# Patient Record
Sex: Female | Born: 1955
Health system: Southern US, Community
[De-identification: ages and names within clinical notes are randomized; demographics above are authoritative.]

## PROBLEM LIST (undated history)

## (undated) ENCOUNTER — Emergency Department (HOSPITAL_BASED_OUTPATIENT_CLINIC_OR_DEPARTMENT_OTHER): Payer: Self-pay | Source: Home / Self Care

## (undated) DIAGNOSIS — K611 Rectal abscess: Secondary | ICD-10-CM

## (undated) DIAGNOSIS — I10 Essential (primary) hypertension: Secondary | ICD-10-CM

## (undated) DIAGNOSIS — K602 Anal fissure, unspecified: Secondary | ICD-10-CM

---

## 1999-10-18 ENCOUNTER — Other Ambulatory Visit: Admission: RE | Admit: 1999-10-18 | Discharge: 1999-10-18 | Payer: Self-pay | Admitting: *Deleted

## 2001-02-25 ENCOUNTER — Other Ambulatory Visit: Admission: RE | Admit: 2001-02-25 | Discharge: 2001-02-25 | Payer: Self-pay | Admitting: Obstetrics and Gynecology

## 2003-03-15 ENCOUNTER — Other Ambulatory Visit: Admission: RE | Admit: 2003-03-15 | Discharge: 2003-03-15 | Payer: Self-pay | Admitting: Obstetrics and Gynecology

## 2005-05-01 ENCOUNTER — Encounter: Admission: RE | Admit: 2005-05-01 | Discharge: 2005-05-01 | Payer: Self-pay | Admitting: Unknown Physician Specialty

## 2016-10-31 DIAGNOSIS — R110 Nausea: Secondary | ICD-10-CM | POA: Diagnosis not present

## 2016-10-31 DIAGNOSIS — R51 Headache: Secondary | ICD-10-CM | POA: Diagnosis not present

## 2016-10-31 DIAGNOSIS — R42 Dizziness and giddiness: Secondary | ICD-10-CM | POA: Diagnosis not present

## 2016-11-01 DIAGNOSIS — R51 Headache: Secondary | ICD-10-CM | POA: Diagnosis not present

## 2016-11-01 DIAGNOSIS — R42 Dizziness and giddiness: Secondary | ICD-10-CM | POA: Diagnosis not present

## 2016-11-01 DIAGNOSIS — Z8489 Family history of other specified conditions: Secondary | ICD-10-CM | POA: Diagnosis not present

## 2016-11-08 DIAGNOSIS — D329 Benign neoplasm of meninges, unspecified: Secondary | ICD-10-CM | POA: Diagnosis not present

## 2016-11-15 DIAGNOSIS — D329 Benign neoplasm of meninges, unspecified: Secondary | ICD-10-CM | POA: Diagnosis not present

## 2017-01-13 DIAGNOSIS — I10 Essential (primary) hypertension: Secondary | ICD-10-CM | POA: Diagnosis not present

## 2017-01-13 DIAGNOSIS — E785 Hyperlipidemia, unspecified: Secondary | ICD-10-CM | POA: Diagnosis not present

## 2017-02-05 DIAGNOSIS — R922 Inconclusive mammogram: Secondary | ICD-10-CM | POA: Diagnosis not present

## 2017-02-05 DIAGNOSIS — N6311 Unspecified lump in the right breast, upper outer quadrant: Secondary | ICD-10-CM | POA: Diagnosis not present

## 2017-02-28 DIAGNOSIS — D329 Benign neoplasm of meninges, unspecified: Secondary | ICD-10-CM | POA: Diagnosis not present

## 2017-03-26 DIAGNOSIS — J209 Acute bronchitis, unspecified: Secondary | ICD-10-CM | POA: Diagnosis not present

## 2017-08-19 DIAGNOSIS — D329 Benign neoplasm of meninges, unspecified: Secondary | ICD-10-CM | POA: Diagnosis not present

## 2017-12-05 DIAGNOSIS — H04123 Dry eye syndrome of bilateral lacrimal glands: Secondary | ICD-10-CM | POA: Diagnosis not present

## 2017-12-05 DIAGNOSIS — H2513 Age-related nuclear cataract, bilateral: Secondary | ICD-10-CM | POA: Diagnosis not present

## 2017-12-05 DIAGNOSIS — H1852 Epithelial (juvenile) corneal dystrophy: Secondary | ICD-10-CM | POA: Diagnosis not present

## 2018-01-12 DIAGNOSIS — Z6832 Body mass index (BMI) 32.0-32.9, adult: Secondary | ICD-10-CM | POA: Diagnosis not present

## 2018-01-12 DIAGNOSIS — Z0000 Encounter for general adult medical examination without abnormal findings: Secondary | ICD-10-CM | POA: Diagnosis not present

## 2018-01-12 DIAGNOSIS — Z23 Encounter for immunization: Secondary | ICD-10-CM | POA: Diagnosis not present

## 2020-09-04 DIAGNOSIS — R197 Diarrhea, unspecified: Secondary | ICD-10-CM | POA: Diagnosis not present

## 2020-09-05 DIAGNOSIS — R197 Diarrhea, unspecified: Secondary | ICD-10-CM | POA: Diagnosis not present

## 2020-11-03 DIAGNOSIS — R194 Change in bowel habit: Secondary | ICD-10-CM | POA: Diagnosis not present

## 2020-11-03 DIAGNOSIS — K602 Anal fissure, unspecified: Secondary | ICD-10-CM | POA: Diagnosis not present

## 2020-12-20 DIAGNOSIS — Z6830 Body mass index (BMI) 30.0-30.9, adult: Secondary | ICD-10-CM | POA: Diagnosis not present

## 2020-12-20 DIAGNOSIS — L0231 Cutaneous abscess of buttock: Secondary | ICD-10-CM | POA: Diagnosis not present

## 2020-12-22 DIAGNOSIS — Z23 Encounter for immunization: Secondary | ICD-10-CM | POA: Diagnosis not present

## 2020-12-25 DIAGNOSIS — Z6830 Body mass index (BMI) 30.0-30.9, adult: Secondary | ICD-10-CM | POA: Diagnosis not present

## 2020-12-25 DIAGNOSIS — L729 Follicular cyst of the skin and subcutaneous tissue, unspecified: Secondary | ICD-10-CM | POA: Diagnosis not present

## 2020-12-26 DIAGNOSIS — L089 Local infection of the skin and subcutaneous tissue, unspecified: Secondary | ICD-10-CM | POA: Diagnosis not present

## 2020-12-26 DIAGNOSIS — L723 Sebaceous cyst: Secondary | ICD-10-CM | POA: Diagnosis not present

## 2021-01-05 DIAGNOSIS — Z23 Encounter for immunization: Secondary | ICD-10-CM | POA: Diagnosis not present

## 2021-01-15 DIAGNOSIS — R194 Change in bowel habit: Secondary | ICD-10-CM | POA: Diagnosis not present

## 2021-01-15 DIAGNOSIS — K602 Anal fissure, unspecified: Secondary | ICD-10-CM | POA: Diagnosis not present

## 2021-01-15 DIAGNOSIS — K6289 Other specified diseases of anus and rectum: Secondary | ICD-10-CM | POA: Diagnosis not present

## 2021-02-11 ENCOUNTER — Encounter (HOSPITAL_COMMUNITY): Payer: Self-pay | Admitting: Emergency Medicine

## 2021-02-11 ENCOUNTER — Emergency Department (HOSPITAL_COMMUNITY): Payer: BC Managed Care – PPO

## 2021-02-11 ENCOUNTER — Other Ambulatory Visit: Payer: Self-pay

## 2021-02-11 ENCOUNTER — Emergency Department (HOSPITAL_COMMUNITY)
Admission: EM | Admit: 2021-02-11 | Discharge: 2021-02-11 | Disposition: A | Discharge status: Home / Self Care | Payer: BC Managed Care – PPO | Attending: Emergency Medicine | Admitting: Emergency Medicine

## 2021-02-11 DIAGNOSIS — R197 Diarrhea, unspecified: Secondary | ICD-10-CM | POA: Diagnosis not present

## 2021-02-11 DIAGNOSIS — R109 Unspecified abdominal pain: Secondary | ICD-10-CM | POA: Diagnosis not present

## 2021-02-11 HISTORY — DX: Rectal abscess: K61.1

## 2021-02-11 HISTORY — DX: Anal fissure, unspecified: K60.2

## 2021-02-11 HISTORY — DX: Essential (primary) hypertension: I10

## 2021-02-11 LAB — COMPREHENSIVE METABOLIC PANEL
ALT: 26 U/L (ref 0–44)
AST: 38 U/L (ref 15–41)
Albumin: 5.3 g/dL — ABNORMAL HIGH (ref 3.5–5.0)
Alkaline Phosphatase: 49 U/L (ref 38–126)
Anion gap: 11 (ref 5–15)
BUN: 9 mg/dL (ref 8–23)
CO2: 25 mmol/L (ref 22–32)
Calcium: 10.1 mg/dL (ref 8.9–10.3)
Chloride: 101 mmol/L (ref 98–111)
Creatinine, Ser: 0.97 mg/dL (ref 0.44–1.00)
GFR, Estimated: >60 mL/min (ref >60)
Glucose, Bld: 121 mg/dL — ABNORMAL HIGH (ref 70–99)
Potassium: 4.2 mmol/L (ref 3.5–5.1)
Sodium: 137 mmol/L (ref 135–145)
Total Bilirubin: 1.6 mg/dL — ABNORMAL HIGH (ref 0.3–1.2)
Total Protein: 7.9 g/dL (ref 6.5–8.1)

## 2021-02-11 LAB — URINALYSIS, ROUTINE W REFLEX MICROSCOPIC
Bacteria, UA: NONE SEEN
Bilirubin Urine: NEGATIVE
Glucose, UA: NEGATIVE mg/dL
Ketones, ur: NEGATIVE mg/dL
Nitrite: NEGATIVE
Protein, ur: NEGATIVE mg/dL
Specific Gravity, Urine: 1.02 (ref 1.005–1.030)
pH: 8 (ref 5.0–8.0)

## 2021-02-11 LAB — CBC WITH DIFFERENTIAL/PLATELET
Abs Immature Granulocytes: 0.05 10*3/uL (ref 0.00–0.07)
Basophils Absolute: 0.1 10*3/uL (ref 0.0–0.1)
Basophils Relative: 0 %
Eosinophils Absolute: 0.1 10*3/uL (ref 0.0–0.5)
Eosinophils Relative: 1 %
HCT: 43.1 % (ref 36.0–46.0)
Hemoglobin: 14.9 g/dL (ref 12.0–15.0)
Immature Granulocytes: 0 %
Lymphocytes Relative: 7 %
Lymphs Abs: 1.1 10*3/uL (ref 0.7–4.0)
MCH: 31.6 pg (ref 26.0–34.0)
MCHC: 34.6 g/dL (ref 30.0–36.0)
MCV: 91.5 fL (ref 80.0–100.0)
Monocytes Absolute: 1.3 10*3/uL — ABNORMAL HIGH (ref 0.1–1.0)
Monocytes Relative: 8 %
Neutro Abs: 12.4 10*3/uL — ABNORMAL HIGH (ref 1.7–7.7)
Neutrophils Relative %: 84 %
Platelets: 323 10*3/uL (ref 150–400)
RBC: 4.71 MIL/uL (ref 3.87–5.11)
RDW: 11.7 % (ref 11.5–15.5)
WBC: 15 10*3/uL — ABNORMAL HIGH (ref 4.0–10.5)
nRBC: 0 % (ref 0.0–0.2)

## 2021-02-11 LAB — LIPASE, BLOOD: Lipase: 26 U/L (ref 11–51)

## 2021-02-11 MED ORDER — ONDANSETRON HCL 4 MG/2ML IJ SOLN
4.0000 mg | Freq: Once | INTRAMUSCULAR | Status: AC
  Administered 2021-02-11: 16:00:00 4 mg via INTRAVENOUS
  Filled 2021-02-11: qty 2

## 2021-02-11 MED ORDER — MORPHINE SULFATE (PF) 2 MG/ML IV SOLN
2.0000 mg | Freq: Once | INTRAVENOUS | Status: AC
  Administered 2021-02-11: 16:00:00 2 mg via INTRAVENOUS
  Filled 2021-02-11: qty 1

## 2021-02-11 MED ORDER — IOHEXOL 350 MG/ML SOLN
80.0000 mL | Freq: Once | INTRAVENOUS | Status: AC | PRN
  Administered 2021-02-11: 16:00:00 80 mL via INTRAVENOUS

## 2021-02-11 MED ORDER — SODIUM CHLORIDE 0.9 % IV BOLUS
1000.0000 mL | Freq: Once | INTRAVENOUS | Status: AC
  Administered 2021-02-11: 16:00:00 1000 mL via INTRAVENOUS

## 2021-02-11 NOTE — ED Provider Notes (Signed)
Emergency Medicine Provider Triage Evaluation Note  Rhonda Dickerson , a 65 y.o. female  was evaluated in triage.  Pt complains of abdominal pain and diarrhea.  10-12 episodes of loose stool since this morning.  Last episode had mucousy blood.  Had surgery for perirectal abscess aprox 1 month ago >>Dr. Marcello Moores.  States she has been having some pain since.  She is followed by Dr. Daisey Must with GI who thought maybe she has a fissure.  No fever, emesis  Review of Systems  Positive: Abdominal pain, bleeding Negative: Fever, emesis  Physical Exam  BP (!) 160/103 (BP Location: Left Arm)   Pulse 85   Temp 97.9 F (36.6 C) (Oral)   Resp 18   SpO2 95%  Gen:   Awake, no distress   Resp:  Normal effort  MSK:   Moves extremities without difficulty  Other:    Medical Decision Making  Medically screening exam initiated at 2:45 PM.  Appropriate orders placed.  Radha Coggins was informed that the remainder of the evaluation will be completed by another provider, this initial triage assessment does not replace that evaluation, and the importance of remaining in the ED until their evaluation is complete.  Abd pain, diarrhea, bloody from rectum   Sakib Noguez A, PA-C 02/11/21 1447    Varney Biles, MD 02/11/21 1540

## 2021-02-11 NOTE — ED Provider Notes (Signed)
[facility] DEPT Provider Note   CSN: [id number] Arrival date & time: 02/11/21  1422     History Chief Complaint  Patient presents with   Diarrhea   Rectal Bleeding    Rhonda Dickerson is a 65 y.o. female.  65 year old female with prior medical history as detailed below presents for evaluation.  Patient complains of abdominal cramping and profuse loose watery diarrhea.  Symptoms started this morning.  She reports about 12 episodes of loose stool since this morning.  Last several episodes reportedly had blood present in the stool.  She denies associated fever.  She reports associated nausea.  She denies vomiting.  She does report prior recent perirectal abscess drain is approximately 1 month ago with Dr. Marcello Moores.  She reports that she has not been on antibiotics since that procedure.  She is also known to Dr. Jason Coop with GI.  Per the patient, GI thinks that she may have a very small fissure.  The history is provided by the patient.  Diarrhea Quality:  Bloody, mucous and watery Severity:  Moderate Onset quality:  Sudden Number of episodes:  12 Duration:  1 day Timing:  Rare Progression:  Unchanged Relieved by:  Nothing Worsened by:  Nothing Rectal Bleeding     Past Medical History:  Diagnosis Date   Anal fissure    Hypertension    Perirectal abscess     There are no problems to display for this patient.   History reviewed. No pertinent surgical history.   OB History   No obstetric history on file.     History reviewed. No pertinent family history.  Social History   Tobacco Use   Smoking status: Never   Smokeless tobacco: Never    Home Medications Prior to Admission medications   Not on File    Allergies    Patient has no known allergies.  Review of Systems   Review of Systems  Gastrointestinal:  Positive for diarrhea and hematochezia.  All other systems reviewed and are negative.  Physical Exam Updated Vital Signs BP  (!) 160/103 (BP Location: Left Arm)   Pulse 85   Temp 97.9 F (36.6 C) (Oral)   Resp 18   Ht 5\' 2"  (1.575 m)   Wt 70.3 kg   SpO2 95%   BMI 28.35 kg/m   Physical Exam Vitals and nursing note reviewed.  Constitutional:      General: She is not in acute distress.    Appearance: Normal appearance. She is well-developed.  HENT:     Head: Normocephalic and atraumatic.  Eyes:     Conjunctiva/sclera: Conjunctivae normal.     Pupils: Pupils are equal, round, and reactive to light.  Cardiovascular:     Rate and Rhythm: Normal rate and regular rhythm.     Heart sounds: Normal heart sounds.  Pulmonary:     Effort: Pulmonary effort is normal. No respiratory distress.     Breath sounds: Normal breath sounds.  Abdominal:     General: There is no distension.     Palpations: Abdomen is soft.     Tenderness: There is no abdominal tenderness.  Genitourinary:    Comments: Visual rectal exam performed with RN chaperone present in room  Patient with well-healed incision site at prior I and D.   No perirectal erythema or inflammation noted.  No fluctuance noted.  Small number of hemorrhoidal tags present on the rectum itself.  No sign of recent bleeding or hemorrhoidal inflammation.  Patient declines DRE  secondary to rectal pain. Musculoskeletal:        General: No deformity. Normal range of motion.     Cervical back: Normal range of motion and neck supple.  Skin:    General: Skin is warm and dry.  Neurological:     General: No focal deficit present.     Mental Status: She is alert and oriented to person, place, and time.    ED Results / Procedures / Treatments   Labs (all labs ordered are listed, but only abnormal results are displayed) Labs Reviewed  CBC WITH DIFFERENTIAL/PLATELET - Abnormal; Notable for the following components:      Result Value   WBC 15.0 (*)    Neutro Abs 12.4 (*)    Monocytes Absolute 1.3 (*)    All other components within normal limits  COMPREHENSIVE  METABOLIC PANEL - Abnormal; Notable for the following components:   Glucose, Bld 121 (*)    Albumin 5.3 (*)    Total Bilirubin 1.6 (*)    All other components within normal limits  URINALYSIS, ROUTINE W REFLEX MICROSCOPIC - Abnormal; Notable for the following components:   Color, Urine COLORLESS (*)    Hgb urine dipstick SMALL (*)    Leukocytes,Ua SMALL (*)    All other components within normal limits  LIPASE, BLOOD  POC OCCULT BLOOD, ED  TYPE AND SCREEN    EKG None  Radiology CT Abdomen Pelvis W Contrast  Result Date: 02/11/2021 CLINICAL DATA:  Left lower quadrant abdominal pain. EXAM: CT ABDOMEN AND PELVIS WITH CONTRAST TECHNIQUE: Multidetector CT imaging of the abdomen and pelvis was performed using the standard protocol following bolus administration of intravenous contrast. CONTRAST:  71mL OMNIPAQUE IOHEXOL 350 MG/ML SOLN COMPARISON:  None. FINDINGS: Lower chest: No acute abnormality. Hepatobiliary: No focal liver abnormality is seen. No gallstones, gallbladder wall thickening, or biliary dilatation. Pancreas: Unremarkable. No pancreatic ductal dilatation or surrounding inflammatory changes. Spleen: Normal in size without focal abnormality. Adrenals/Urinary Tract: Adrenal glands are unremarkable. Kidneys are normal, without renal calculi, focal lesion, or hydronephrosis. Bladder is unremarkable. Stomach/Bowel: Stomach is within normal limits. Appendix appears normal. No evidence of bowel wall thickening, distention, or inflammatory changes. Occasional sigmoid diverticula without evidence of acute diverticulitis. Vascular/Lymphatic: No significant vascular findings are present. No enlarged abdominal or pelvic lymph nodes. Reproductive: Status post hysterectomy. No adnexal masses. Other: No abdominal wall hernia or abnormality. No abdominopelvic ascites. Musculoskeletal: No acute or significant osseous findings. IMPRESSION: 1. No evidence of acute abnormalities within the abdomen or pelvis.  2. Occasional sigmoid diverticulosis without evidence of acute diverticulitis. Electronically Signed   By: Fidela Salisbury M.D.   On: 02/11/2021 17:22    Procedures Procedures   Medications Ordered in ED Medications  sodium chloride 0.9 % bolus 1,000 mL (1,000 mLs Intravenous New Bag/Given 02/11/21 1628)  ondansetron (ZOFRAN) injection 4 mg (4 mg Intravenous Given 02/11/21 1610)  morphine 2 MG/ML injection 2 mg (2 mg Intravenous Given 02/11/21 1610)  iohexol (OMNIPAQUE) 350 MG/ML injection 80 mL (80 mLs Intravenous Contrast Given 02/11/21 1613)    ED Course  I have reviewed the triage vital signs and the nursing notes.  Pertinent labs & imaging results that were available during my care of the patient were reviewed by me and considered in my medical decision making (see chart for details).    MDM Rules/Calculators/A&P  MDM  MSE complete  Cortney Beissel was evaluated in Emergency Department on 02/11/2021 for the symptoms described in the history of present illness. She was evaluated in the context of the global COVID-19 pandemic, which necessitated consideration that the patient might be at risk for infection with the SARS-CoV-2 virus that causes COVID-19. Institutional protocols and algorithms that pertain to the evaluation of patients at risk for COVID-19 are in a state of rapid change based on information released by regulatory bodies including the CDC and federal and state organizations. These policies and algorithms were followed during the patient's care in the ED.  Patient presented with complaint of multiple episodes of loose watery stools through the course of today.  This was associated with some reported "stringy, clotted blood" passing.  Work-up in the ED is on the whole without significant abnormality.  Screening labs obtained are without significant abnormality.  Patient with hemoglobin of 14.9.  Declined DRE.  She preferred to provide a stool  sample with a BM.  External rectal exam did not demonstrate active bleeding or thrombosed hemorrhoid.  CT imaging was without significant abnormality.  Patient does have history of diverticulosis.  Patient feels significantly improved after ED evaluation.  Of note, she had no BM or rectal bleeding during the 5 hours of her ED stay.  Patient is reassured.  She does have established GI care.  She understands need for close outpatient follow-up with same.  Strict return precautions given and understood.   Final Clinical Impression(s) / ED Diagnoses Final diagnoses:  Diarrhea, unspecified type    Rx / DC Orders ED Discharge Orders     None        Valarie Merino, MD 02/11/21 1925

## 2021-02-11 NOTE — ED Triage Notes (Signed)
PT c/o diarrhea 10-12x since this morning with blood in last stool. Hx surgery for perirectal abscess x1 month ago.

## 2021-02-11 NOTE — Discharge Instructions (Signed)
Return for any problem.   Followup with Dr. Watt Climes as instructed.

## 2021-02-14 DIAGNOSIS — K625 Hemorrhage of anus and rectum: Secondary | ICD-10-CM | POA: Diagnosis not present

## 2021-02-14 DIAGNOSIS — R194 Change in bowel habit: Secondary | ICD-10-CM | POA: Diagnosis not present

## 2021-02-14 DIAGNOSIS — K602 Anal fissure, unspecified: Secondary | ICD-10-CM | POA: Diagnosis not present

## 2021-02-16 DIAGNOSIS — K5289 Other specified noninfective gastroenteritis and colitis: Secondary | ICD-10-CM | POA: Diagnosis not present

## 2021-02-16 DIAGNOSIS — K5730 Diverticulosis of large intestine without perforation or abscess without bleeding: Secondary | ICD-10-CM | POA: Diagnosis not present

## 2021-02-16 DIAGNOSIS — K921 Melena: Secondary | ICD-10-CM | POA: Diagnosis not present

## 2021-02-16 DIAGNOSIS — K649 Unspecified hemorrhoids: Secondary | ICD-10-CM | POA: Diagnosis not present

## 2021-02-20 DIAGNOSIS — L723 Sebaceous cyst: Secondary | ICD-10-CM | POA: Diagnosis not present

## 2021-02-26 DIAGNOSIS — K649 Unspecified hemorrhoids: Secondary | ICD-10-CM | POA: Diagnosis not present

## 2021-02-26 DIAGNOSIS — Z6827 Body mass index (BMI) 27.0-27.9, adult: Secondary | ICD-10-CM | POA: Diagnosis not present

## 2021-03-01 DIAGNOSIS — K602 Anal fissure, unspecified: Secondary | ICD-10-CM | POA: Diagnosis not present

## 2021-03-19 DIAGNOSIS — Z1331 Encounter for screening for depression: Secondary | ICD-10-CM | POA: Diagnosis not present

## 2021-03-19 DIAGNOSIS — I10 Essential (primary) hypertension: Secondary | ICD-10-CM | POA: Diagnosis not present

## 2021-03-19 DIAGNOSIS — Z6825 Body mass index (BMI) 25.0-25.9, adult: Secondary | ICD-10-CM | POA: Diagnosis not present

## 2021-03-19 DIAGNOSIS — E7800 Pure hypercholesterolemia, unspecified: Secondary | ICD-10-CM | POA: Diagnosis not present

## 2021-04-19 DIAGNOSIS — K602 Anal fissure, unspecified: Secondary | ICD-10-CM | POA: Diagnosis not present

## 2021-04-27 DIAGNOSIS — R509 Fever, unspecified: Secondary | ICD-10-CM | POA: Diagnosis not present

## 2021-04-27 DIAGNOSIS — Z6824 Body mass index (BMI) 24.0-24.9, adult: Secondary | ICD-10-CM | POA: Diagnosis not present

## 2021-04-27 DIAGNOSIS — M5432 Sciatica, left side: Secondary | ICD-10-CM | POA: Diagnosis not present

## 2021-04-27 DIAGNOSIS — M25552 Pain in left hip: Secondary | ICD-10-CM | POA: Diagnosis not present

## 2021-05-08 DIAGNOSIS — M62552 Muscle wasting and atrophy, not elsewhere classified, left thigh: Secondary | ICD-10-CM | POA: Diagnosis not present

## 2021-05-08 DIAGNOSIS — M5432 Sciatica, left side: Secondary | ICD-10-CM | POA: Diagnosis not present

## 2021-05-08 DIAGNOSIS — R2689 Other abnormalities of gait and mobility: Secondary | ICD-10-CM | POA: Diagnosis not present

## 2021-05-11 DIAGNOSIS — M5432 Sciatica, left side: Secondary | ICD-10-CM | POA: Diagnosis not present

## 2021-05-11 DIAGNOSIS — R2689 Other abnormalities of gait and mobility: Secondary | ICD-10-CM | POA: Diagnosis not present

## 2021-05-11 DIAGNOSIS — M62552 Muscle wasting and atrophy, not elsewhere classified, left thigh: Secondary | ICD-10-CM | POA: Diagnosis not present

## 2021-05-17 DIAGNOSIS — M62552 Muscle wasting and atrophy, not elsewhere classified, left thigh: Secondary | ICD-10-CM | POA: Diagnosis not present

## 2021-05-17 DIAGNOSIS — R2689 Other abnormalities of gait and mobility: Secondary | ICD-10-CM | POA: Diagnosis not present

## 2021-05-17 DIAGNOSIS — M5432 Sciatica, left side: Secondary | ICD-10-CM | POA: Diagnosis not present

## 2021-05-24 DIAGNOSIS — R2689 Other abnormalities of gait and mobility: Secondary | ICD-10-CM | POA: Diagnosis not present

## 2021-05-24 DIAGNOSIS — M5432 Sciatica, left side: Secondary | ICD-10-CM | POA: Diagnosis not present

## 2021-05-24 DIAGNOSIS — M62552 Muscle wasting and atrophy, not elsewhere classified, left thigh: Secondary | ICD-10-CM | POA: Diagnosis not present

## 2021-05-31 DIAGNOSIS — M5432 Sciatica, left side: Secondary | ICD-10-CM | POA: Diagnosis not present

## 2021-05-31 DIAGNOSIS — M62552 Muscle wasting and atrophy, not elsewhere classified, left thigh: Secondary | ICD-10-CM | POA: Diagnosis not present

## 2021-05-31 DIAGNOSIS — R2689 Other abnormalities of gait and mobility: Secondary | ICD-10-CM | POA: Diagnosis not present

## 2021-06-07 DIAGNOSIS — M62552 Muscle wasting and atrophy, not elsewhere classified, left thigh: Secondary | ICD-10-CM | POA: Diagnosis not present

## 2021-06-07 DIAGNOSIS — R2689 Other abnormalities of gait and mobility: Secondary | ICD-10-CM | POA: Diagnosis not present

## 2021-06-07 DIAGNOSIS — M5432 Sciatica, left side: Secondary | ICD-10-CM | POA: Diagnosis not present

## 2022-10-07 IMAGING — CT CT ABD-PELV W/ CM
2 of 5 series · 16 of 46 positions shown, 18 images · IV contrast (omnipaque)
Comparison: None.

CLINICAL DATA: Left lower quadrant abdominal pain.

EXAM:
CT ABDOMEN AND PELVIS WITH CONTRAST
TECHNIQUE: Multidetector CT imaging of the abdomen and pelvis was performed
using the standard protocol following bolus administration of
intravenous contrast.
CONTRAST:  80mL OMNIPAQUE IOHEXOL 350 MG/ML SOLN

[Series 2: axial st · axial · 0.68mm/px · z∈[+920,+1290]mm · 13 of 88 slices shown, 15 images]
[im 7/88  soft-tissue]
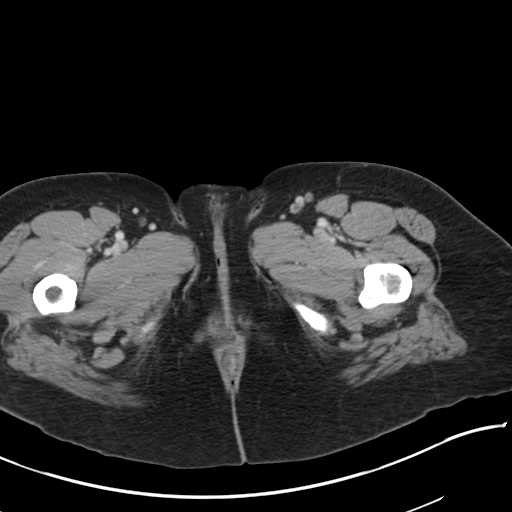
[im 7/88  bone]
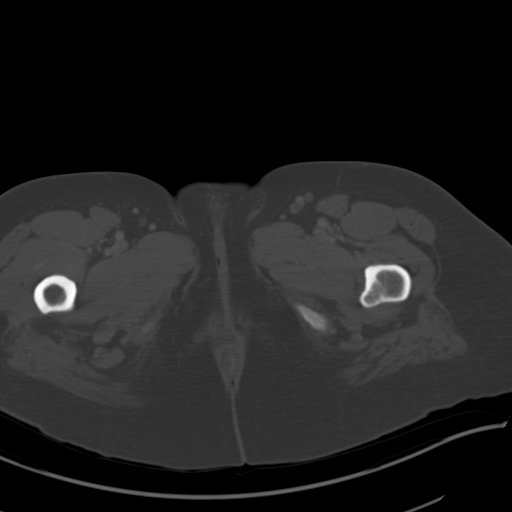
[im 13/88  soft-tissue]
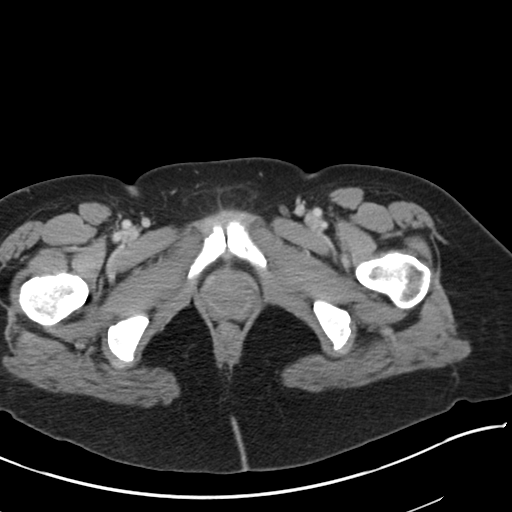
[im 19/88  soft-tissue]
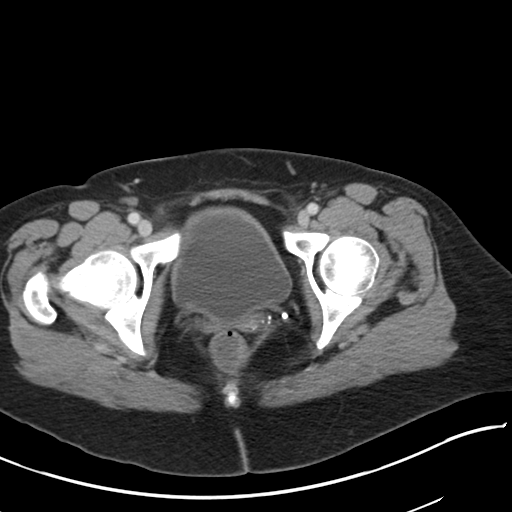
[im 25/88  soft-tissue]
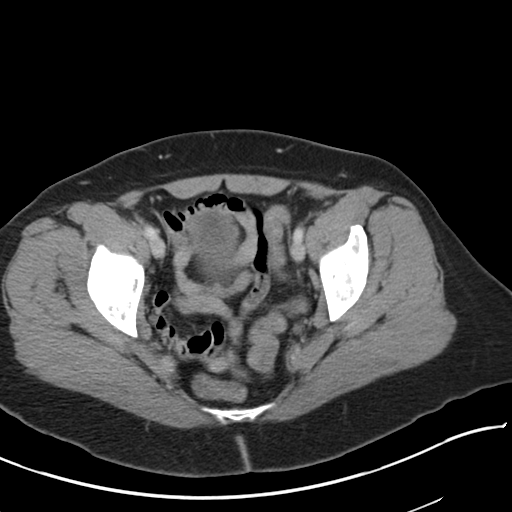
[im 32/88  soft-tissue]
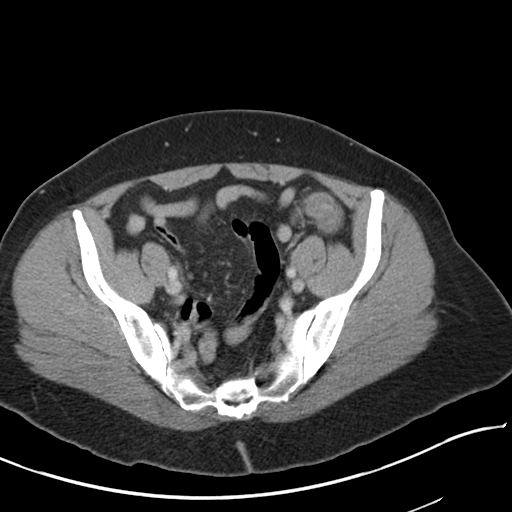
[im 38/88  soft-tissue]
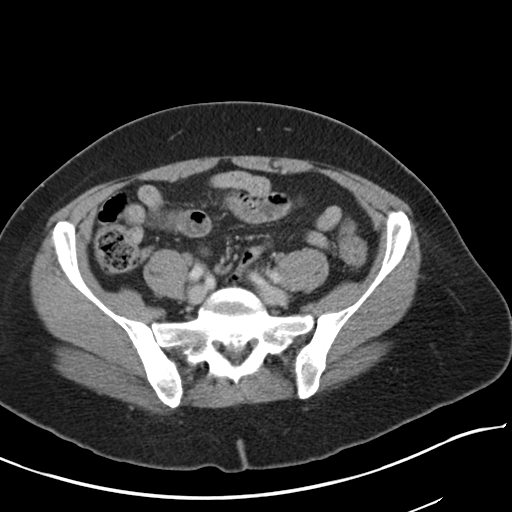
[im 44/88  soft-tissue]
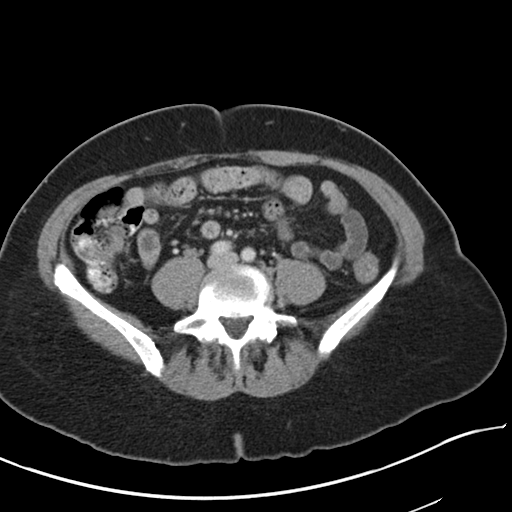
[im 50/88  soft-tissue]
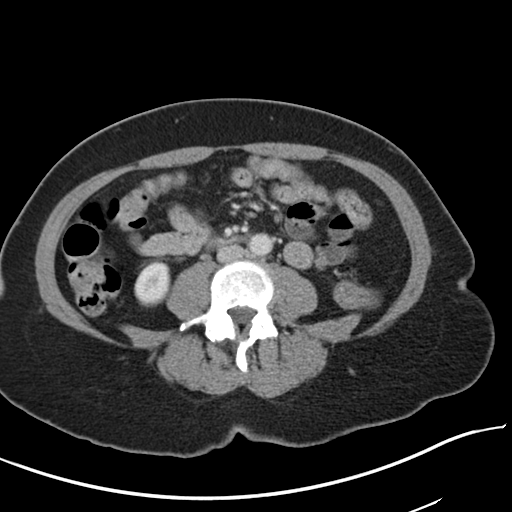
[im 56/88  soft-tissue]
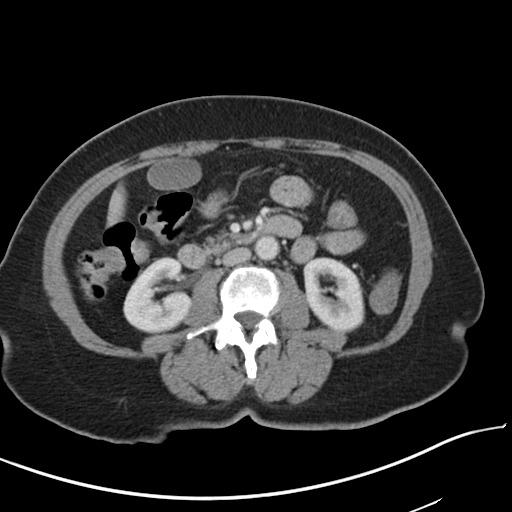
[im 56/88  bone]
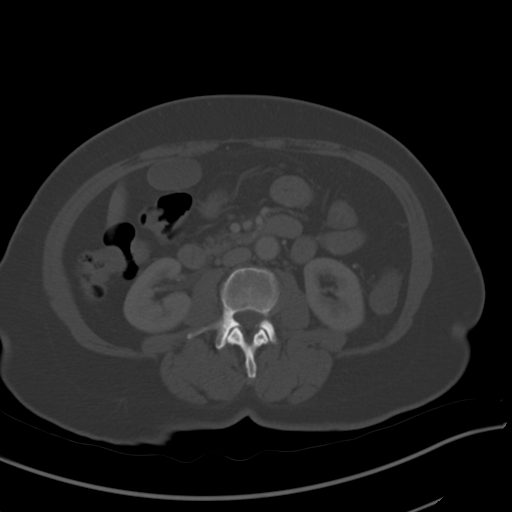
[im 63/88  soft-tissue]
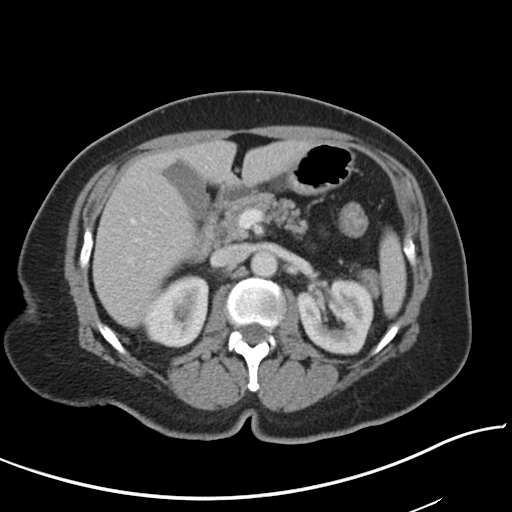
[im 69/88  soft-tissue]
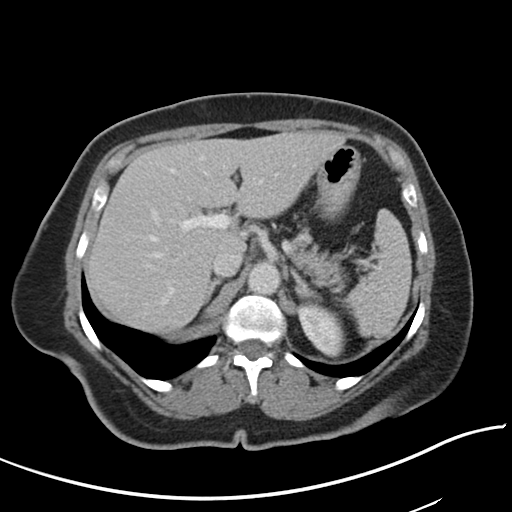
[im 75/88  soft-tissue]
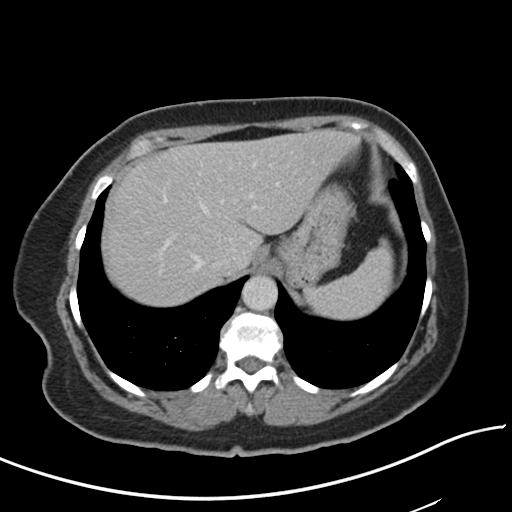
[im 81/88  soft-tissue]
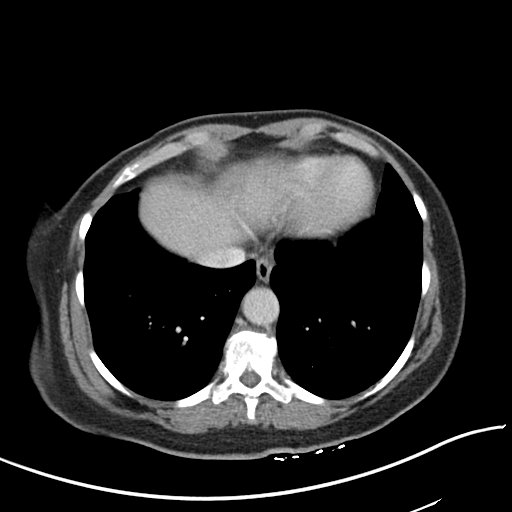

[Series 5: coronal st · coronal · 0.83mm/px · 3 of 128 slices shown]
[im 43/128  soft-tissue]
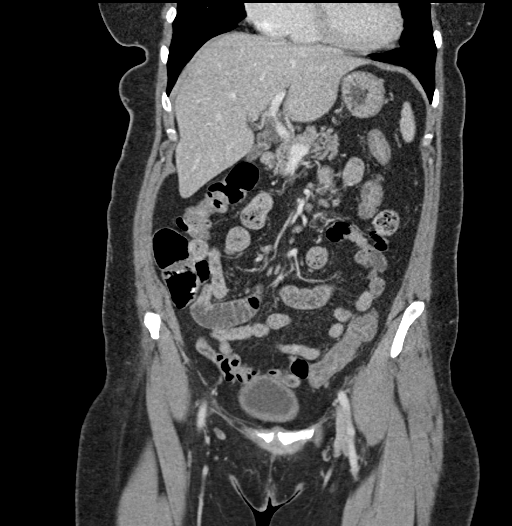
[im 57/128  soft-tissue]
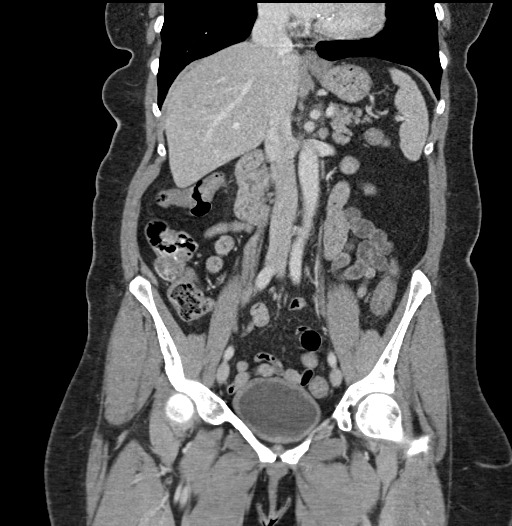
[im 71/128  soft-tissue]
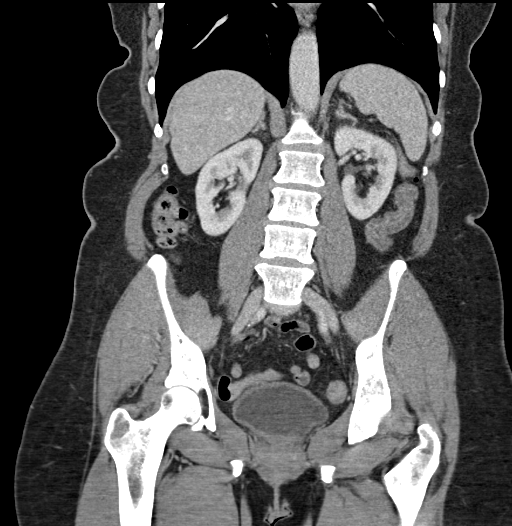

[16 of 46 positions shown; findings below may reference images not displayed]

FINDINGS: Lower chest: No acute abnormality.

Hepatobiliary: No focal liver abnormality is seen. No gallstones,
gallbladder wall thickening, or biliary dilatation.

Pancreas: Unremarkable. No pancreatic ductal dilatation or
surrounding inflammatory changes.

Spleen: Normal in size without focal abnormality.

Adrenals/Urinary Tract: Adrenal glands are unremarkable. Kidneys are
normal, without renal calculi, focal lesion, or hydronephrosis.
Bladder is unremarkable.

Stomach/Bowel: Stomach is within normal limits. Appendix appears
normal. No evidence of bowel wall thickening, distention, or
inflammatory changes. Occasional sigmoid diverticula without
evidence of acute diverticulitis.

Vascular/Lymphatic: No significant vascular findings are present. No
enlarged abdominal or pelvic lymph nodes.

Reproductive: Status post hysterectomy. No adnexal masses.

Other: No abdominal wall hernia or abnormality. No abdominopelvic
ascites.

Musculoskeletal: No acute or significant osseous findings.
IMPRESSION: 1. No evidence of acute abnormalities within the abdomen or pelvis.
2. Occasional sigmoid diverticulosis without evidence of acute
diverticulitis.

## 2023-12-06 ENCOUNTER — Encounter (HOSPITAL_BASED_OUTPATIENT_CLINIC_OR_DEPARTMENT_OTHER): Payer: Self-pay

## 2023-12-06 ENCOUNTER — Ambulatory Visit (HOSPITAL_BASED_OUTPATIENT_CLINIC_OR_DEPARTMENT_OTHER): Admission: EM | Admit: 2023-12-06 | Discharge: 2023-12-06 | Disposition: A | Discharge status: Home / Self Care

## 2023-12-06 DIAGNOSIS — M7989 Other specified soft tissue disorders: Secondary | ICD-10-CM | POA: Diagnosis not present

## 2023-12-06 MED ORDER — CEPHALEXIN 500 MG PO CAPS: 500.0000 mg | ORAL_CAPSULE | Freq: Four times a day (QID) | ORAL | 0 refills | Status: AC

## 2023-12-06 NOTE — ED Triage Notes (Signed)
 Patient was looking for pumpkins on Wednesday/Thursday. Believes she might have either come in contact with a cactus or pumpkin vine and had fine splinter hairs to tip to right index finger. Thought she had pulled the hairs out. Finger was sore but noticed swelling of finger late last night. +Pain and swelling to finger today. Patient states she knows she did not hit it. Believes there is an irritating splinter or something in it. Does take warm baths but has not intentionally soaked finger.

## 2023-12-06 NOTE — ED Provider Notes (Signed)
 [facility]    CSN: [id number] Arrival date & time: 12/06/23  1444      History   Chief Complaint Chief Complaint  Patient presents with   finger swelling    HPI Rhonda Dickerson is a 68 y.o. female.   Pt is a 68 year old female that presents with finger swelling. Patient was looking for pumpkins on Wednesday/Thursday. Believes she might have either come in contact with a cactus or pumpkin vine and had fine splinter hairs to tip to right index finger. Thought she had pulled the hairs out. Finger was sore but noticed swelling of finger late last night. +Pain and swelling to finger today. Patient states she knows she did not hit it. Believes there is an irritating splinter or something in it. Does take warm baths but has not intentionally soaked finger.      Past Medical History:  Diagnosis Date   Anal fissure    Hypertension    Perirectal abscess     There are no active problems to display for this patient.   History reviewed. No pertinent surgical history.  OB History   No obstetric history on file.      Home Medications    Prior to Admission medications   Medication Sig Start Date End Date Taking? Authorizing Provider  BENICAR 20 MG tablet Take 20 mg by mouth daily. 02/07/14  Yes [provider]  cephALEXin (KEFLEX) 500 MG capsule Take 1 capsule (500 mg total) by mouth 4 (four) times daily. 12/06/23  Yes Safiya Girdler A, FNP  CRESTOR 10 MG tablet Take 10 mg by mouth daily. 02/07/14  Yes [provider]  gabapentin (NEURONTIN) 300 MG capsule Take 300 mg by mouth 2 (two) times daily. 02/07/14  Yes [provider]  LORazepam (ATIVAN) 1 MG tablet Take 1 mg by mouth at bedtime as needed for sleep. 02/07/14  Yes [provider]  ZETIA 10 MG tablet Take 10 mg by mouth daily. 02/07/14  Yes [provider]  aspirin 81 MG chewable tablet Chew 81 mg by mouth daily.    [provider]    Family History History  reviewed. No pertinent family history.  Social History Social History   Tobacco Use   Smoking status: Never   Smokeless tobacco: Never     Allergies   Phenergan [promethazine]   Review of Systems Review of Systems See HPI  Physical Exam Triage Vital Signs ED Triage Vitals  Encounter Vitals Group     BP 12/06/23 1505 (!) 162/91     Girls Systolic BP Percentile --      Girls Diastolic BP Percentile --      Boys Systolic BP Percentile --      Boys Diastolic BP Percentile --      Pulse Rate 12/06/23 1505 82     Resp 12/06/23 1505 20     Temp 12/06/23 1505 98.3 F (36.8 C)     Temp Source 12/06/23 1505 Oral     SpO2 12/06/23 1505 98 %     Weight --      Height --      Head Circumference --      Peak Flow --      Pain Score 12/06/23 1508 5     Pain Loc --      Pain Education --      Exclude from Growth Chart --    No data found.  Updated Vital Signs BP (!) 162/91 (BP  Location: Left Arm)   Pulse 82   Temp 98.3 F (36.8 C) (Oral)   Resp 20   SpO2 98%   Visual Acuity Right Eye Distance:   Left Eye Distance:   Bilateral Distance:    Right Eye Near:   Left Eye Near:    Bilateral Near:     Physical Exam Vitals and nursing note reviewed.  Constitutional:      General: She is not in acute distress.    Appearance: Normal appearance. She is not ill-appearing, toxic-appearing or diaphoretic.  Pulmonary:     Effort: Pulmonary effort is normal.  Skin:    General: Skin is warm and dry.     Comments: Moderate swelling and erythema to the tip and nailbed of the right index finger. Some red streaking. TTP   Neurological:     Mental Status: She is alert.  Psychiatric:        Mood and Affect: Mood normal.      UC Treatments / Results  Labs (all labs ordered are listed, but only abnormal results are displayed) Labs Reviewed - No data to display  EKG   Radiology No results found.  Procedures Procedures (including critical care time)  Medications  Ordered in UC Medications - No data to display  Initial Impression / Assessment and Plan / UC Course  I have reviewed the triage vital signs and the nursing notes.  Pertinent labs & imaging results that were available during my care of the patient were reviewed by me and considered in my medical decision making (see chart for details).     Finger swelling-treating for infection at this time.  Treating with Keflex.  Recommend warm soaks to the finger.  Ibuprofen and Tylenol for pain as needed.  Follow-up as needed Final Clinical Impressions(s) / UC Diagnoses   Final diagnoses:  Finger swelling     Discharge Instructions      Treating for skin infection. Take the antibiotics as prescribed. Warm soaks to the finger.  Ibuprofen and Tylenol for pain as needed.  Follow up if problem worsens     ED Prescriptions     Medication Sig Dispense Auth. Provider   cephALEXin (KEFLEX) 500 MG capsule Take 1 capsule (500 mg total) by mouth 4 (four) times daily. 28 capsule Adah Corning A, FNP      PDMP not reviewed this encounter.   Adah Corning LABOR, FNP 12/07/23 [phone]

## 2023-12-06 NOTE — Discharge Instructions (Signed)
 Treating for skin infection. Take the antibiotics as prescribed. Warm soaks to the finger.  Ibuprofen and Tylenol for pain as needed.  Follow up if problem worsens
